# Patient Record
Sex: Male | Born: 1995 | Race: White | Hispanic: No | Marital: Single | Smoking: Never smoker
Health system: Southern US, Community
[De-identification: ages and names within clinical notes are randomized; demographics above are authoritative.]

## PROBLEM LIST (undated history)

## (undated) DIAGNOSIS — T7840XA Allergy, unspecified, initial encounter: Secondary | ICD-10-CM

## (undated) HISTORY — PX: EYE SURGERY: SHX253

## (undated) HISTORY — DX: Allergy, unspecified, initial encounter: T78.40XA

---
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
## 2016-12-18 IMAGING — CR DG WRIST COMPLETE 3+V*L*
4 series · 4 of 4 positions shown · non-contrast
Comparison: None.

CLINICAL DATA: Ulnar side wrist pain on the left for 2 weeks.
Started after lifting sofa.

EXAM:
LEFT WRIST - COMPLETE 3+ VIEW

[PA]
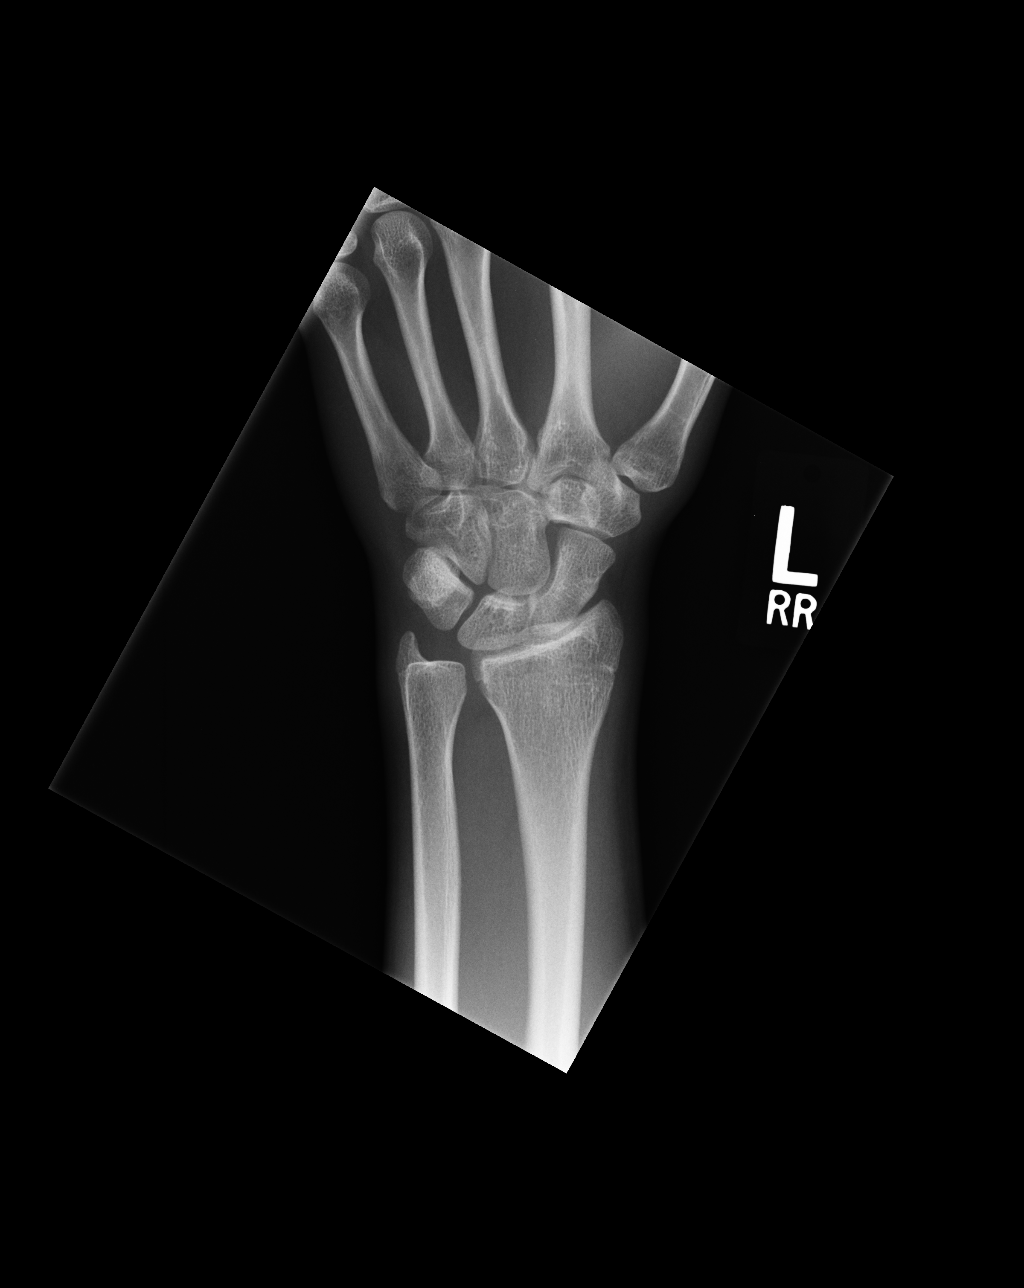

[pa int rot]
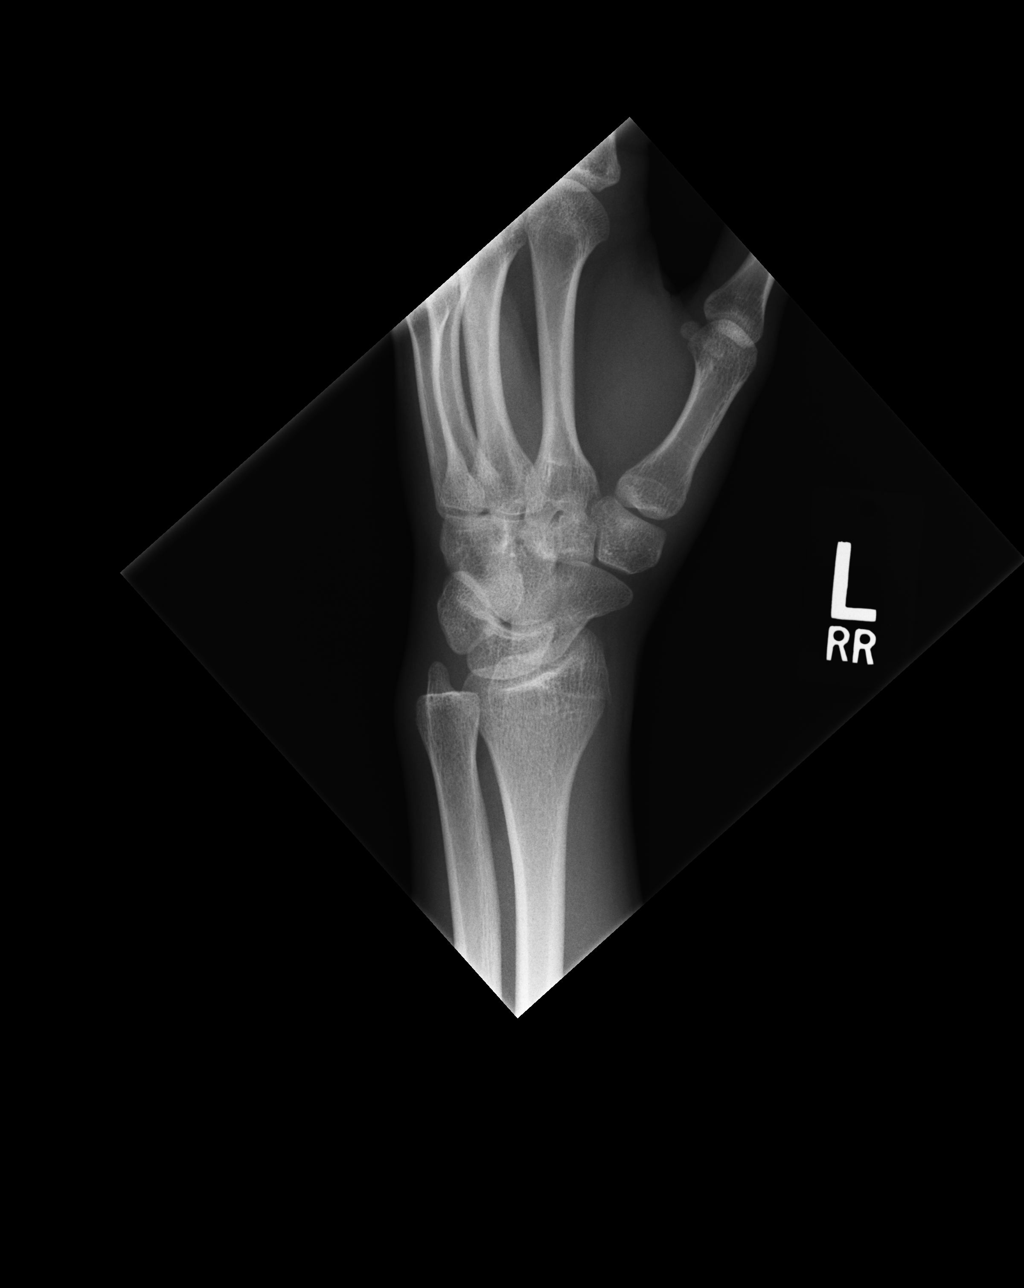

[lateral]
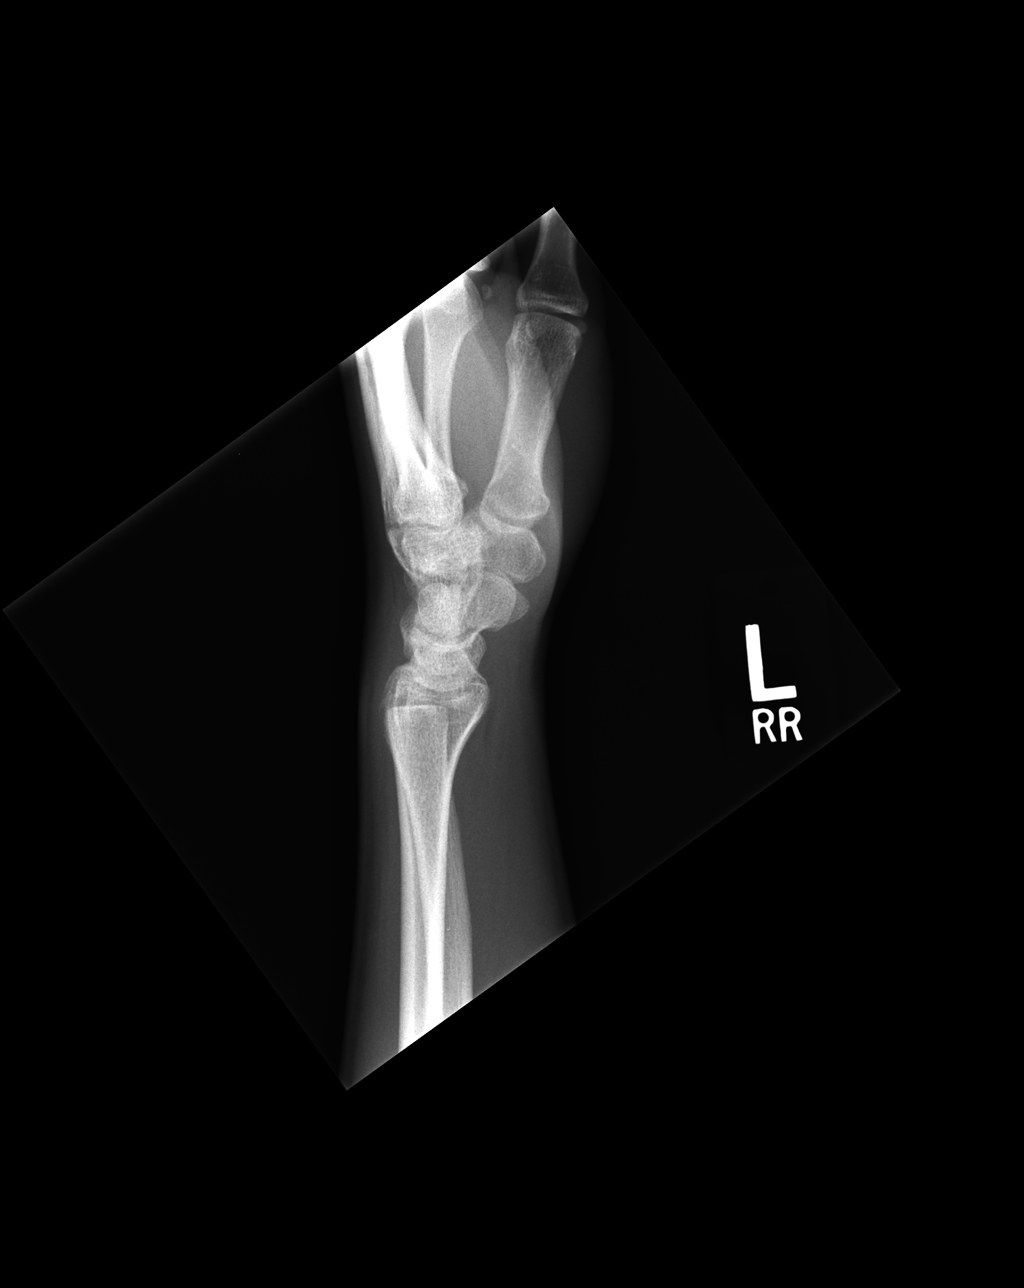

[ap ext rot]
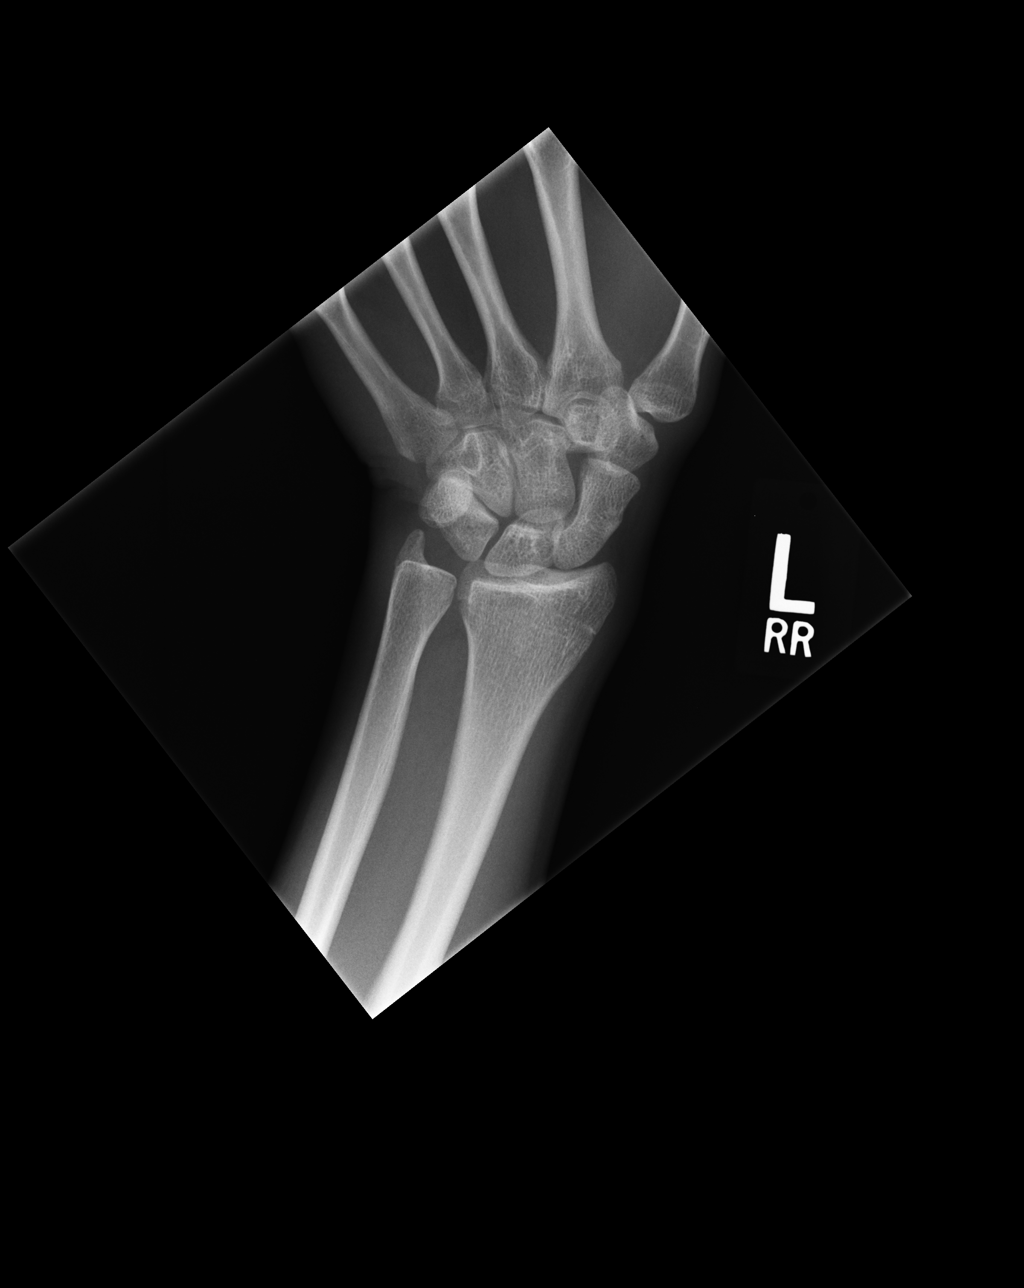

[4 of 4 positions shown; findings below may reference images not displayed]

FINDINGS: There is no evidence of fracture or dislocation. There is no
evidence of arthropathy or other focal bone abnormality. Soft
tissues are unremarkable.
IMPRESSION: Negative.
